# Patient Record
Sex: Male | Born: 2000 | Race: Black or African American | Hispanic: No | Marital: Single
Health system: Southern US, Community
[De-identification: ages and names within clinical notes are randomized; demographics above are authoritative.]

---
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
## 2019-06-12 IMAGING — DX DG KNEE COMPLETE 4+V*L*
4 series · 4 of 4 positions shown · non-contrast
Comparison: None.

CLINICAL DATA: Medial left knee pain after twisting injury in
football practice 2 days ago.

EXAM:
LEFT KNEE - COMPLETE 4+ VIEW

[knee ap (1 of 2)]
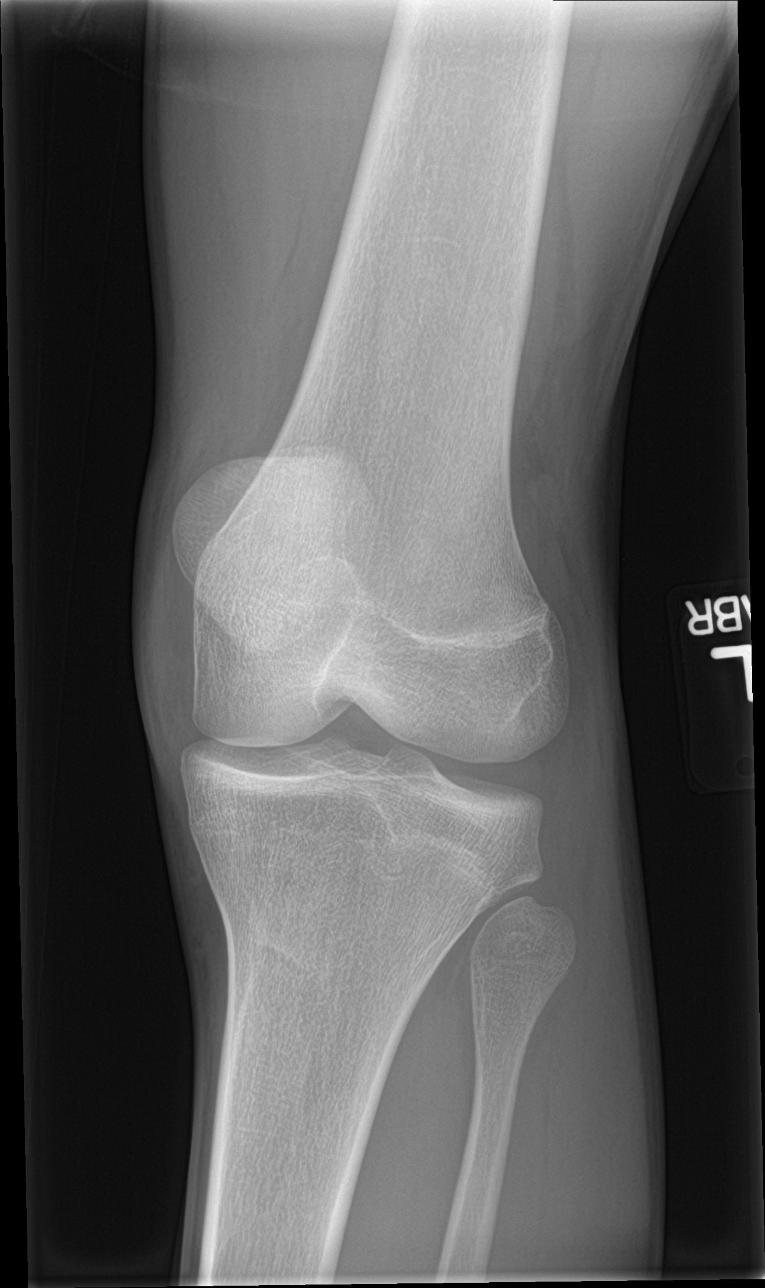

[knee ap (2 of 2)]
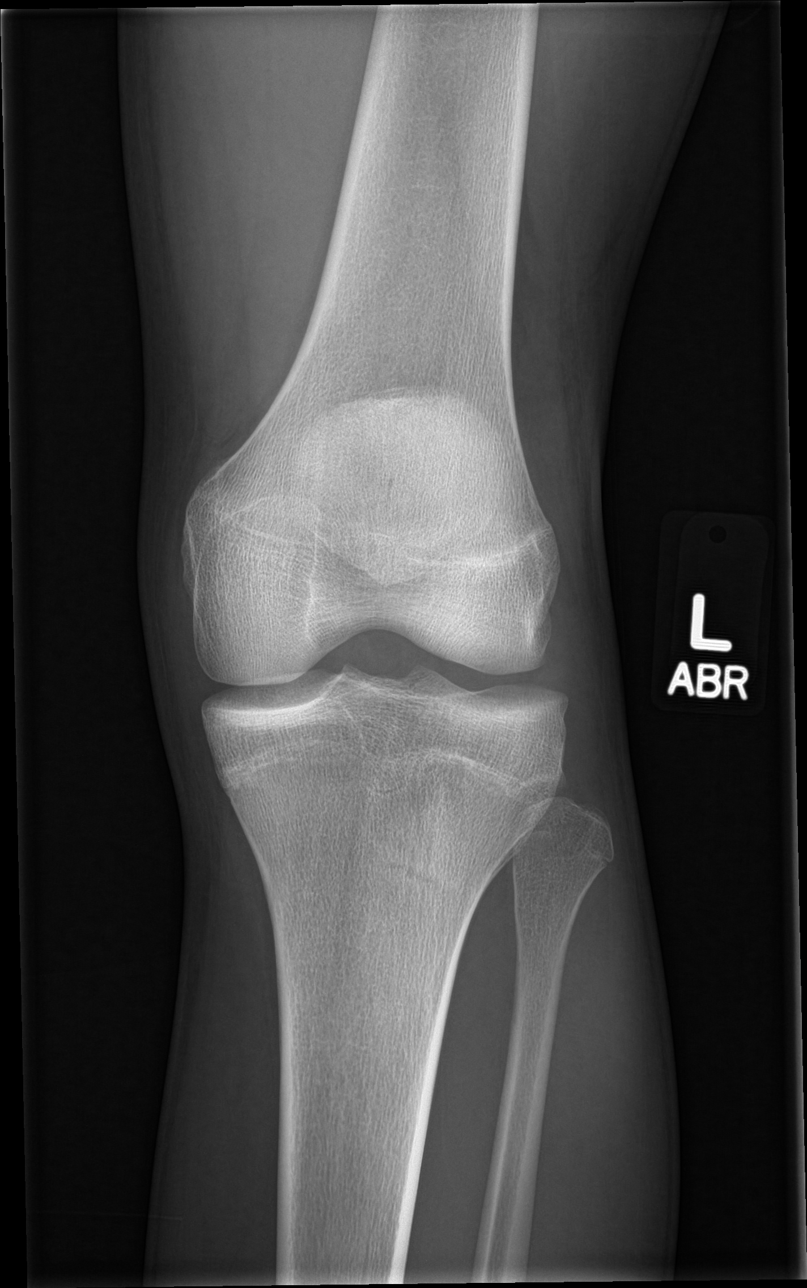

[knee obl]
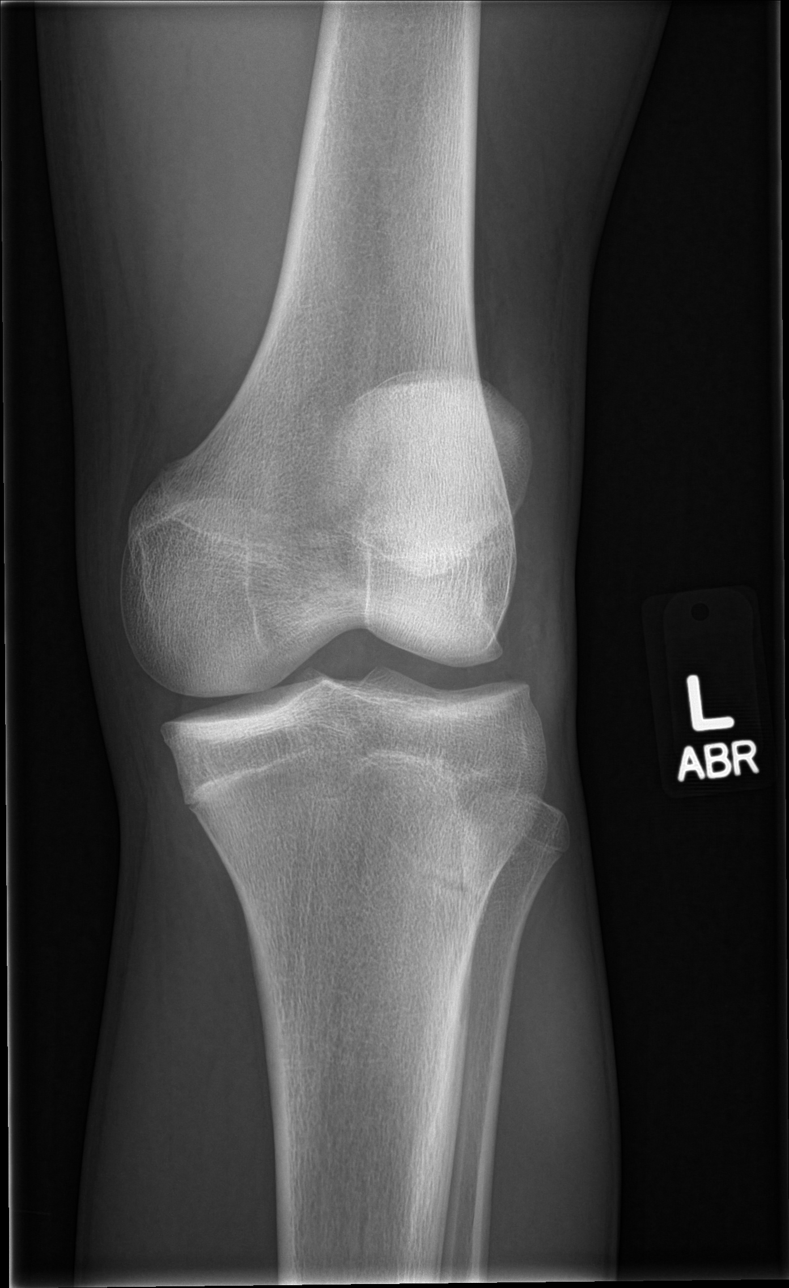

[knee lat]
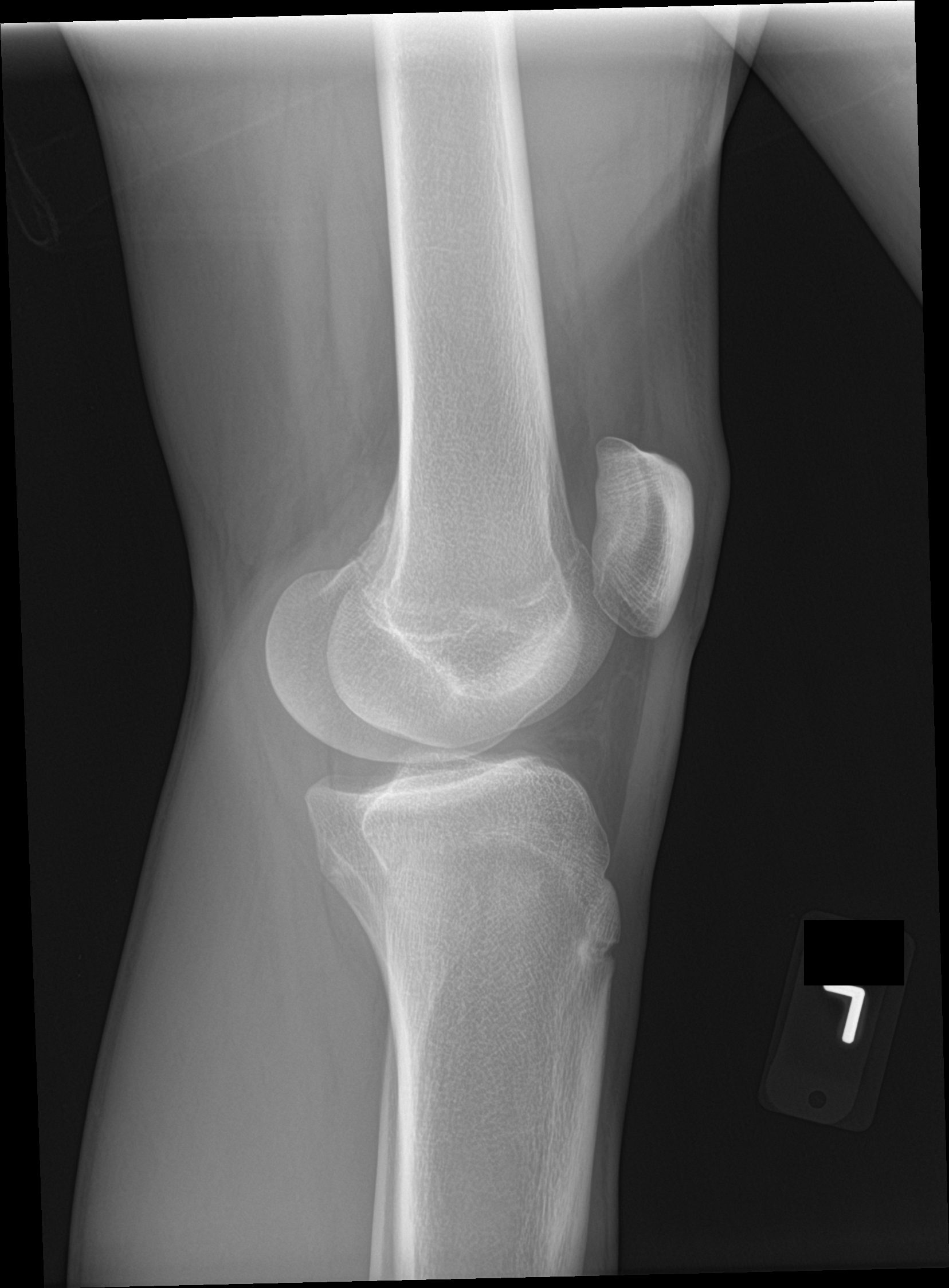

[4 of 4 positions shown; findings below may reference images not displayed]

FINDINGS: No evidence of fracture, dislocation, or joint effusion. No evidence
of arthropathy or other focal bone abnormality. Soft tissues are
unremarkable.
IMPRESSION: Negative.

## 2020-03-16 DIAGNOSIS — R509 Fever, unspecified: Secondary | ICD-10-CM | POA: Diagnosis not present

## 2020-03-16 DIAGNOSIS — R059 Cough, unspecified: Secondary | ICD-10-CM | POA: Diagnosis not present

## 2020-03-16 DIAGNOSIS — Z20822 Contact with and (suspected) exposure to covid-19: Secondary | ICD-10-CM | POA: Diagnosis not present

## 2020-09-28 ENCOUNTER — Encounter: Payer: Self-pay | Admitting: Pediatrics

## 2021-01-07 DIAGNOSIS — R109 Unspecified abdominal pain: Secondary | ICD-10-CM | POA: Diagnosis not present
